# Patient Record
Sex: Male | Born: 1980 | Race: Black or African American | Hispanic: No | Marital: Single | Smoking: Current every day smoker
Health system: Southern US, Community
[De-identification: ages and names within clinical notes are randomized; demographics above are authoritative.]

---

## 2016-11-04 ENCOUNTER — Emergency Department (HOSPITAL_BASED_OUTPATIENT_CLINIC_OR_DEPARTMENT_OTHER): Payer: Commercial Managed Care - PPO

## 2016-11-04 ENCOUNTER — Emergency Department (HOSPITAL_BASED_OUTPATIENT_CLINIC_OR_DEPARTMENT_OTHER)
Admission: EM | Admit: 2016-11-04 | Discharge: 2016-11-04 | Disposition: A | Discharge status: Home / Self Care | Payer: Commercial Managed Care - PPO | Attending: Emergency Medicine | Admitting: Emergency Medicine

## 2016-11-04 ENCOUNTER — Encounter (HOSPITAL_BASED_OUTPATIENT_CLINIC_OR_DEPARTMENT_OTHER): Payer: Self-pay | Admitting: *Deleted

## 2016-11-04 DIAGNOSIS — M25462 Effusion, left knee: Secondary | ICD-10-CM | POA: Insufficient documentation

## 2016-11-04 DIAGNOSIS — M25562 Pain in left knee: Secondary | ICD-10-CM

## 2016-11-04 DIAGNOSIS — F17210 Nicotine dependence, cigarettes, uncomplicated: Secondary | ICD-10-CM | POA: Diagnosis not present

## 2016-11-04 MED ORDER — IBUPROFEN 600 MG PO TABS: 600.0000 mg | ORAL_TABLET | Freq: Four times a day (QID) | ORAL | 0 refills | Status: AC | PRN

## 2016-11-04 NOTE — ED Provider Notes (Signed)
[facility]   CSN: [id number] Arrival date & time: 11/04/16  1742     History   Chief Complaint Chief Complaint  Patient presents with  . Knee Pain    HPI Carlos Barry is a 36 y.o. male.  HPI Patient is with left-sided knee pain and swelling for the last month. Worse with pivoting. States she has intermittent feelings that the knee gets "stuck" or "gives out". No fever or chills. No redness or warmth. No known injury. Denies any calf swelling or tenderness. History reviewed. No pertinent past medical history.  There are no active problems to display for this patient.   History reviewed. No pertinent surgical history.     Home Medications    Prior to Admission medications   Medication Sig Start Date End Date Taking? Authorizing Provider  ibuprofen (ADVIL,MOTRIN) 600 MG tablet Take 1 tablet (600 mg total) by mouth every 6 (six) hours as needed. 11/04/16   Loren Racer, MD    Family History History reviewed. No pertinent family history.  Social History Social History  Substance Use Topics  . Smoking status: Current Every Day Smoker    Packs/day: 1.00    Types: Cigarettes  . Smokeless tobacco: Never Used  . Alcohol use No     Allergies   Patient has no known allergies.   Review of Systems Review of Systems  Constitutional: Negative for chills and fever.  Musculoskeletal: Positive for arthralgias and joint swelling. Negative for myalgias.  Skin: Negative for rash and wound.  Neurological: Negative for weakness and numbness.  All other systems reviewed and are negative.    Physical Exam Updated Vital Signs BP 121/64 (BP Location: Left Arm)   Pulse 70   Temp 97.5 F (36.4 C) (Oral)   Resp 18   Ht  (1.651 m)   Wt 197 lb (89.4 kg)   SpO2 97%   BMI 32.78 kg/m   Physical Exam  Constitutional: He is oriented to person, place, and time. He appears well-developed and well-nourished.  HENT:  Head: Normocephalic and  atraumatic.  Eyes: EOM are normal. Pupils are equal, round, and reactive to light.  Neck: Normal range of motion. Neck supple.  Cardiovascular: Normal rate.   Pulmonary/Chest: Effort normal.  Abdominal: Soft.  Musculoskeletal: Normal range of motion. He exhibits edema and tenderness. He exhibits no deformity.  Patient with suprapatellar effusion of the left knee. He is tender to palpation over the medial tibial plateau. There is no erythema or warmth. Full range of motion. No popliteal fossa masses. No ligamentous instability. 2+ dorsalis pedis and posterior tibial pulses.  Neurological: He is alert and oriented to person, place, and time.  5/5 motor in all extremities. Sensation fully intact. Ambulating without difficulty.  Skin: Skin is warm and dry. Capillary refill takes less than 2 seconds. No rash noted. No erythema.  Psychiatric: He has a normal mood and affect. His behavior is normal.  Nursing note and vitals reviewed.    ED Treatments / Results  Labs (all labs ordered are listed, but only abnormal results are displayed) Labs Reviewed - No data to display  EKG  EKG Interpretation None       Radiology Dg Knee Complete 4 Views Left  Result Date: 11/04/2016 CLINICAL DATA:  Left knee pain for 1 month EXAM: LEFT KNEE - COMPLETE 4+ VIEW COMPARISON:  None. FINDINGS: No fracture or malalignment. Minimal degenerative spurring of the medial knee. Possible trace effusion. IMPRESSION: No acute osseous abnormality.  Possible trace suprapatellar E fusion. Electronically Signed   By: Jasmine Pang M.D.   On: 11/04/2016 18:33    Procedures Procedures (including critical care time)  Medications Ordered in ED Medications - No data to display   Initial Impression / Assessment and Plan / ED Course  I have reviewed the triage vital signs and the nursing notes.  Pertinent labs & imaging results that were available during my care of the patient were reviewed by me and considered in my  medical decision making (see chart for details).     Low suspicion for infectious process. Likely meniscal tear. Will check x-ray to rule out bony deformity. X-ray without acute bony abnormality. Placed in knee immobilizer and given crutches. Will refer to orthopedist. Final Clinical Impressions(s) / ED Diagnoses   Final diagnoses:  Acute pain of left knee  Effusion of left knee    New Prescriptions New Prescriptions   IBUPROFEN (ADVIL,MOTRIN) 600 MG TABLET    Take 1 tablet (600 mg total) by mouth every 6 (six) hours as needed.     Loren Racer, MD 11/04/16 [phone]

## 2016-11-04 NOTE — ED Triage Notes (Signed)
Pt reports left knee pain x 1 month.  

## 2018-03-09 ENCOUNTER — Emergency Department (HOSPITAL_BASED_OUTPATIENT_CLINIC_OR_DEPARTMENT_OTHER)
Admission: EM | Admit: 2018-03-09 | Discharge: 2018-03-09 | Disposition: A | Discharge status: Home / Self Care | Payer: Medicaid Other | Attending: Emergency Medicine | Admitting: Emergency Medicine

## 2018-03-09 ENCOUNTER — Other Ambulatory Visit: Payer: Self-pay

## 2018-03-09 ENCOUNTER — Encounter (HOSPITAL_BASED_OUTPATIENT_CLINIC_OR_DEPARTMENT_OTHER): Payer: Self-pay

## 2018-03-09 DIAGNOSIS — M25569 Pain in unspecified knee: Secondary | ICD-10-CM | POA: Diagnosis present

## 2018-03-09 DIAGNOSIS — M25561 Pain in right knee: Secondary | ICD-10-CM | POA: Insufficient documentation

## 2018-03-09 DIAGNOSIS — M25562 Pain in left knee: Secondary | ICD-10-CM | POA: Insufficient documentation

## 2018-03-09 MED ORDER — TRAMADOL HCL 50 MG PO TABS: 50.0000 mg | ORAL_TABLET | Freq: Two times a day (BID) | ORAL | 0 refills | Status: AC | PRN

## 2018-03-09 MED ORDER — DEXAMETHASONE 4 MG PO TABS: 4.0000 mg | ORAL_TABLET | Freq: Two times a day (BID) | ORAL | 0 refills | Status: AC

## 2018-03-09 NOTE — ED Provider Notes (Signed)
[facility] EMERGENCY DEPARTMENT Provider Note   CSN: [id number] Arrival date & time: 03/09/18  1254     History   Chief Complaint Chief Complaint  Patient presents with  . Knee Pain    HPI Carlos Barry is a 37 y.o. male.  HPI   37 year old male with bilateral knee pain.  Aching for the past several days.  Patient reports that he is on his feet for extended period of time.  His knee pain tends to be worse towards the end of the day.  Is somewhat improved in the morning.  Is noticed some mild swelling in both of his knees.  No fevers or chills.  No swelling distally.  Denies any acute trauma.  History reviewed. No pertinent past medical history.  There are no active problems to display for this patient.   History reviewed. No pertinent surgical history.      Home Medications    Prior to Admission medications   Medication Sig Start Date End Date Taking? Authorizing Provider  ibuprofen (ADVIL,MOTRIN) 600 MG tablet Take 1 tablet (600 mg total) by mouth every 6 (six) hours as needed. 11/04/16   Loren Racer, MD    Family History No family history on file.  Social History Social History   Tobacco Use  . Smoking status: Current Every Day Smoker    Packs/day: 1.00    Types: Cigarettes  . Smokeless tobacco: Never Used  Substance Use Topics  . Alcohol use: No  . Drug use: No     Allergies   Patient has no known allergies.   Review of Systems Review of Systems  All systems reviewed and negative, other than as noted in HPI.   Physical Exam Updated Vital Signs BP 121/81 (BP Location: Left Arm)   Pulse 62   Temp 98.5 F (36.9 C) (Oral)   Resp 18   Ht 5\' 4"  (1.626 m)   Wt 99.3 kg (219 lb)   SpO2 98%   BMI 37.59 kg/m   Physical Exam  Constitutional: He appears well-developed and well-nourished. No distress.  HENT:  Head: Normocephalic and atraumatic.  Eyes: Conjunctivae are normal. Right eye exhibits no discharge. Left eye exhibits no  discharge.  Neck: Neck supple.  Cardiovascular: Normal rate, regular rhythm and normal heart sounds. Exam reveals no gallop and no friction rub.  No murmur heard. Pulmonary/Chest: Effort normal and breath sounds normal. No respiratory distress.  Abdominal: Soft. He exhibits no distension. There is no tenderness.  Musculoskeletal:  Perhaps some mild swelling of both knees.  Symmetric.  Otherwise lower extremities are grossly normal in appearance.  There is no calf tenderness or appreciable swelling distally in the legs.  He is able to actively range both knees without apparent discomfort.  Neurological: He is alert.  Skin: Skin is warm and dry.  Psychiatric: He has a normal mood and affect. His behavior is normal. Thought content normal.  Nursing note and vitals reviewed.    ED Treatments / Results  Labs (all labs ordered are listed, but only abnormal results are displayed) Labs Reviewed - No data to display  EKG None  Radiology No results found.  Procedures Procedures (including critical care time)  Medications Ordered in ED Medications - No data to display   Initial Impression / Assessment and Plan / ED Course  I have reviewed the triage vital signs and the nursing notes.  Pertinent labs & imaging results that were available during my care of the patient were reviewed  by me and considered in my medical decision making (see chart for details).     37 year old male with bilateral knee pain.  Likely overuse type injury.  Advised to avoid activities make the pain worse the best that he can.  Ice his knees.  NSAIDs as needed.  There is no evidence of arterial compromise and I highly doubt DVT. Final Clinical Impressions(s) / ED Diagnoses   Final diagnoses:  Pain in both knees, unspecified chronicity    ED Discharge Orders    None       Raeford RazorKohut, Rush Salce, MD 03/13/18 1006

## 2018-03-09 NOTE — ED Triage Notes (Signed)
C/o bilat knee pain x 4 days-denies injury-NAD-steady gait

## 2018-03-09 NOTE — Discharge Instructions (Signed)
Buy some cheap reusable ice packs for your knees and use them after you get off of work. Keep taking ibuprofen 600 mg every 6 hours as needed for pain. Take decadron until finished. Use tramadol for break through pain.

## 2018-09-21 ENCOUNTER — Emergency Department (HOSPITAL_BASED_OUTPATIENT_CLINIC_OR_DEPARTMENT_OTHER)
Admission: EM | Admit: 2018-09-21 | Discharge: 2018-09-21 | Disposition: A | Discharge status: Home / Self Care | Payer: Medicaid Other | Attending: Emergency Medicine | Admitting: Emergency Medicine

## 2018-09-21 ENCOUNTER — Other Ambulatory Visit: Payer: Self-pay

## 2018-09-21 DIAGNOSIS — Y929 Unspecified place or not applicable: Secondary | ICD-10-CM | POA: Insufficient documentation

## 2018-09-21 DIAGNOSIS — S39012A Strain of muscle, fascia and tendon of lower back, initial encounter: Secondary | ICD-10-CM | POA: Diagnosis not present

## 2018-09-21 DIAGNOSIS — F17210 Nicotine dependence, cigarettes, uncomplicated: Secondary | ICD-10-CM | POA: Diagnosis not present

## 2018-09-21 DIAGNOSIS — X58XXXA Exposure to other specified factors, initial encounter: Secondary | ICD-10-CM | POA: Insufficient documentation

## 2018-09-21 DIAGNOSIS — S3992XA Unspecified injury of lower back, initial encounter: Secondary | ICD-10-CM | POA: Diagnosis present

## 2018-09-21 DIAGNOSIS — Y999 Unspecified external cause status: Secondary | ICD-10-CM | POA: Diagnosis not present

## 2018-09-21 DIAGNOSIS — Y939 Activity, unspecified: Secondary | ICD-10-CM | POA: Diagnosis not present

## 2018-09-21 DIAGNOSIS — Z79899 Other long term (current) drug therapy: Secondary | ICD-10-CM | POA: Insufficient documentation

## 2018-09-21 MED ORDER — METHOCARBAMOL 500 MG PO TABS: 500.0000 mg | ORAL_TABLET | Freq: Three times a day (TID) | ORAL | 0 refills | Status: AC | PRN

## 2018-09-21 MED FILL — METHOCARBAMOL 500 MG TABLET: 500 | 2 days supply | Qty: 8 | Fill #0

## 2018-09-21 NOTE — ED Triage Notes (Signed)
Pt reports woking up with lower back pain , no injury nor fall, yet slept on the couch last night , no pain radiation nor urinary symptoms.

## 2018-09-21 NOTE — ED Provider Notes (Signed)
[facility] EMERGENCY DEPARTMENT Provider Note   CSN: [id number] Arrival date & time: 09/21/18  0708    History   Chief Complaint Chief Complaint  Patient presents with  . Back Pain    lower    HPI Carlos Barry is a 38 y.o. male.     HPI Patient with lower back pain.  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  States he thinks he may have hurt his back with that.  Pain in his lower back bilaterally without radiation down the legs.  No numbness or weakness.  No confusion.  No loss of bladder or bowel control.  No fall.  States he normally has some muscle relaxers at home but did not have any this time.  Slept on the couch.  No injection drug use.  No history of cancer. No past medical history on file.  There are no active problems to display for this patient.   No past surgical history on file.      Home Medications    Prior to Admission medications   Medication Sig Start Date End Date Taking? Authorizing Provider  dexamethasone (DECADRON) 4 MG tablet Take 1 tablet (4 mg total) by mouth 2 (two) times daily. 03/09/18   Raeford Razor, MD  ibuprofen (ADVIL,MOTRIN) 600 MG tablet Take 1 tablet (600 mg total) by mouth every 6 (six) hours as needed. 11/04/16   Loren Racer, MD  methocarbamol (ROBAXIN) 500 MG tablet Take 1 tablet (500 mg total) by mouth every 8 (eight) hours as needed for muscle spasms. 09/21/18   Benjiman Core, MD  traMADol (ULTRAM) 50 MG tablet Take 1 tablet (50 mg total) by mouth every 12 (twelve) hours as needed. 03/09/18   Raeford Razor, MD    Family History No family history on file.  Social History Social History   Tobacco Use  . Smoking status: Current Every Day Smoker    Packs/day: 1.00    Types: Cigarettes  . Smokeless tobacco: Never Used  Substance Use Topics  . Alcohol use: No  . Drug use: No     Allergies   Patient has no  known allergies.   Review of Systems Review of Systems  Constitutional: Negative for appetite change, chills and fever.  Gastrointestinal: Negative for abdominal distention.  Genitourinary: Negative for flank pain.  Musculoskeletal: Positive for back pain. Negative for gait problem and neck stiffness.  Skin: Negative for wound.  Neurological: Negative for weakness and numbness.     Physical Exam Updated Vital Signs BP 116/83   Pulse (!) 57   Temp 98.2 F (36.8 C) (Oral)   Resp 18   Ht 5\' 6"  (1.676 m)   Wt 94.8 kg   SpO2 98%   BMI 33.73 kg/m   Physical Exam Vitals signs and nursing note reviewed.  HENT:     Head: Normocephalic.  Neck:     Musculoskeletal: Neck supple.  Cardiovascular:     Rate and Rhythm: Normal rate.  Pulmonary:     Effort: No respiratory distress.  Abdominal:     Tenderness: There is no abdominal tenderness.  Musculoskeletal:     Comments: Mild lumbar musculature tenderness.  Good range of motion.  Good straight leg raise bilaterally.  Perineal sensation intact.  Normal gait.  Some pain with bending over.  Skin:    General: Skin is warm.     Capillary  Refill: Capillary refill takes less than 2 seconds.     Findings: No rash.  Neurological:     General: No focal deficit present.     Mental Status: He is alert.      ED Treatments / Results  Labs (all labs ordered are listed, but only abnormal results are displayed) Labs Reviewed - No data to display  EKG None  Radiology No results found.  Procedures Procedures (including critical care time)  Medications Ordered in ED Medications - No data to display   Initial Impression / Assessment and Plan / ED Course  I have reviewed the triage vital signs and the nursing notes.  Pertinent labs & imaging results that were available during my care of the patient were reviewed by me and considered in my medical decision making (see chart for details).        Patient with low back pain.   Likely musculoskeletal.  No red flags.  Discharge home with muscle relaxer.  Final Clinical Impressions(s) / ED Diagnoses   Final diagnoses:  Strain of lumbar region, initial encounter    ED Discharge Orders         Ordered    methocarbamol (ROBAXIN) 500 MG tablet  Every 8 hours PRN     09/21/18 0734           Benjiman Core, MD 09/21/18 [phone]

## 2020-12-06 ENCOUNTER — Emergency Department (HOSPITAL_COMMUNITY): Payer: Medicaid Other

## 2020-12-06 ENCOUNTER — Emergency Department (HOSPITAL_COMMUNITY)
Admission: EM | Admit: 2020-12-06 | Discharge: 2020-12-06 | Disposition: A | Discharge status: Home / Self Care | Payer: Medicaid Other | Attending: Emergency Medicine | Admitting: Emergency Medicine

## 2020-12-06 ENCOUNTER — Other Ambulatory Visit: Payer: Self-pay

## 2020-12-06 DIAGNOSIS — S060X1A Concussion with loss of consciousness of 30 minutes or less, initial encounter: Secondary | ICD-10-CM | POA: Insufficient documentation

## 2020-12-06 DIAGNOSIS — F17210 Nicotine dependence, cigarettes, uncomplicated: Secondary | ICD-10-CM | POA: Diagnosis not present

## 2020-12-06 DIAGNOSIS — Y92410 Unspecified street and highway as the place of occurrence of the external cause: Secondary | ICD-10-CM | POA: Insufficient documentation

## 2020-12-06 DIAGNOSIS — S0990XA Unspecified injury of head, initial encounter: Secondary | ICD-10-CM | POA: Diagnosis present

## 2020-12-06 DIAGNOSIS — M545 Low back pain, unspecified: Secondary | ICD-10-CM | POA: Diagnosis not present

## 2020-12-06 DIAGNOSIS — M546 Pain in thoracic spine: Secondary | ICD-10-CM | POA: Diagnosis not present

## 2020-12-06 LAB — CBC
HCT: 44.6 % (ref 39.0–52.0)
Hemoglobin: 13.7 g/dL (ref 13.0–17.0)
MCH: 22.7 pg — ABNORMAL LOW (ref 26.0–34.0)
MCHC: 30.7 g/dL (ref 30.0–36.0)
MCV: 73.8 fL — ABNORMAL LOW (ref 80.0–100.0)
Platelets: 225 10*3/uL (ref 150–400)
RBC: 6.04 MIL/uL — ABNORMAL HIGH (ref 4.22–5.81)
RDW: 16.4 % — ABNORMAL HIGH (ref 11.5–15.5)
WBC: 4.8 10*3/uL (ref 4.0–10.5)
nRBC: 0 % (ref 0.0–0.2)

## 2020-12-06 LAB — BASIC METABOLIC PANEL
Anion gap: 7 (ref 5–15)
BUN: 22 mg/dL — ABNORMAL HIGH (ref 6–20)
CO2: 24 mmol/L (ref 22–32)
Calcium: 9.2 mg/dL (ref 8.9–10.3)
Chloride: 107 mmol/L (ref 98–111)
Creatinine, Ser: 1.07 mg/dL (ref 0.61–1.24)
GFR, Estimated: >60 mL/min (ref >60)
Glucose, Bld: 101 mg/dL — ABNORMAL HIGH (ref 70–99)
Potassium: 3.9 mmol/L (ref 3.5–5.1)
Sodium: 138 mmol/L (ref 135–145)

## 2020-12-06 MED ORDER — IOHEXOL 300 MG/ML  SOLN
100.0000 mL | Freq: Once | INTRAMUSCULAR | Status: AC | PRN
  Administered 2020-12-06: 100 mL via INTRAVENOUS

## 2020-12-06 MED ORDER — NAPROXEN 500 MG PO TABS: 500.0000 mg | ORAL_TABLET | Freq: Two times a day (BID) | ORAL | 0 refills | Status: AC

## 2020-12-06 NOTE — ED Notes (Signed)
Assisted patient to the bathroom. Pt ambulatory with steady gait.  
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
## 2020-12-06 NOTE — ED Notes (Signed)
Patient transported to CT 
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
